# Patient Record
Sex: Male | Born: 1955 | Race: Black or African American | Hispanic: No | Marital: Single | Smoking: Former smoker
Health system: Southern US, Community
[De-identification: ages and names within clinical notes are randomized; demographics above are authoritative.]

## PROBLEM LIST (undated history)

## (undated) DIAGNOSIS — I2510 Atherosclerotic heart disease of native coronary artery without angina pectoris: Secondary | ICD-10-CM

## (undated) DIAGNOSIS — Z9989 Dependence on other enabling machines and devices: Secondary | ICD-10-CM

## (undated) DIAGNOSIS — G4733 Obstructive sleep apnea (adult) (pediatric): Secondary | ICD-10-CM

## (undated) DIAGNOSIS — E119 Type 2 diabetes mellitus without complications: Secondary | ICD-10-CM

## (undated) DIAGNOSIS — I441 Atrioventricular block, second degree: Secondary | ICD-10-CM

## (undated) DIAGNOSIS — D638 Anemia in other chronic diseases classified elsewhere: Secondary | ICD-10-CM

## (undated) DIAGNOSIS — I10 Essential (primary) hypertension: Secondary | ICD-10-CM

## (undated) DIAGNOSIS — I482 Chronic atrial fibrillation, unspecified: Secondary | ICD-10-CM

## (undated) DIAGNOSIS — N185 Chronic kidney disease, stage 5: Secondary | ICD-10-CM

## (undated) DIAGNOSIS — I5032 Chronic diastolic (congestive) heart failure: Secondary | ICD-10-CM

## (undated) DIAGNOSIS — I119 Hypertensive heart disease without heart failure: Secondary | ICD-10-CM

## (undated) DIAGNOSIS — K219 Gastro-esophageal reflux disease without esophagitis: Secondary | ICD-10-CM

## (undated) DIAGNOSIS — I1310 Hypertensive heart and chronic kidney disease without heart failure, with stage 1 through stage 4 chronic kidney disease, or unspecified chronic kidney disease: Secondary | ICD-10-CM

## (undated) DIAGNOSIS — I272 Pulmonary hypertension, unspecified: Secondary | ICD-10-CM

## (undated) DIAGNOSIS — E785 Hyperlipidemia, unspecified: Secondary | ICD-10-CM

## (undated) HISTORY — PX: CARDIAC CATHETERIZATION: SHX172

---

## 2013-10-16 ENCOUNTER — Inpatient Hospital Stay (HOSPITAL_COMMUNITY)
Admission: AD | Admit: 2013-10-16 | Discharge: 2013-10-18 | DRG: 286 | Disposition: A | Discharge status: Home / Self Care | Payer: Non-veteran care | Source: Other Acute Inpatient Hospital | Attending: Internal Medicine | Admitting: Internal Medicine

## 2013-10-16 ENCOUNTER — Encounter (HOSPITAL_COMMUNITY): Payer: Self-pay | Admitting: Internal Medicine

## 2013-10-16 DIAGNOSIS — I5033 Acute on chronic diastolic (congestive) heart failure: Secondary | ICD-10-CM | POA: Diagnosis present

## 2013-10-16 DIAGNOSIS — I4891 Unspecified atrial fibrillation: Secondary | ICD-10-CM | POA: Diagnosis present

## 2013-10-16 DIAGNOSIS — I482 Chronic atrial fibrillation, unspecified: Secondary | ICD-10-CM | POA: Diagnosis not present

## 2013-10-16 DIAGNOSIS — N179 Acute kidney failure, unspecified: Secondary | ICD-10-CM | POA: Diagnosis present

## 2013-10-16 DIAGNOSIS — I441 Atrioventricular block, second degree: Secondary | ICD-10-CM | POA: Diagnosis present

## 2013-10-16 DIAGNOSIS — E785 Hyperlipidemia, unspecified: Secondary | ICD-10-CM | POA: Diagnosis present

## 2013-10-16 DIAGNOSIS — N185 Chronic kidney disease, stage 5: Secondary | ICD-10-CM | POA: Diagnosis present

## 2013-10-16 DIAGNOSIS — IMO0001 Reserved for inherently not codable concepts without codable children: Principal | ICD-10-CM | POA: Diagnosis present

## 2013-10-16 DIAGNOSIS — I480 Paroxysmal atrial fibrillation: Secondary | ICD-10-CM

## 2013-10-16 DIAGNOSIS — I509 Heart failure, unspecified: Secondary | ICD-10-CM

## 2013-10-16 DIAGNOSIS — I1310 Hypertensive heart and chronic kidney disease without heart failure, with stage 1 through stage 4 chronic kidney disease, or unspecified chronic kidney disease: Secondary | ICD-10-CM

## 2013-10-16 DIAGNOSIS — D638 Anemia in other chronic diseases classified elsewhere: Secondary | ICD-10-CM | POA: Diagnosis present

## 2013-10-16 DIAGNOSIS — E119 Type 2 diabetes mellitus without complications: Secondary | ICD-10-CM | POA: Diagnosis present

## 2013-10-16 DIAGNOSIS — Z87891 Personal history of nicotine dependence: Secondary | ICD-10-CM

## 2013-10-16 DIAGNOSIS — I119 Hypertensive heart disease without heart failure: Secondary | ICD-10-CM | POA: Diagnosis present

## 2013-10-16 DIAGNOSIS — Z9989 Dependence on other enabling machines and devices: Secondary | ICD-10-CM

## 2013-10-16 DIAGNOSIS — G4733 Obstructive sleep apnea (adult) (pediatric): Secondary | ICD-10-CM | POA: Diagnosis present

## 2013-10-16 DIAGNOSIS — I2789 Other specified pulmonary heart diseases: Secondary | ICD-10-CM | POA: Diagnosis present

## 2013-10-16 DIAGNOSIS — K219 Gastro-esophageal reflux disease without esophagitis: Secondary | ICD-10-CM | POA: Diagnosis present

## 2013-10-16 DIAGNOSIS — I272 Pulmonary hypertension, unspecified: Secondary | ICD-10-CM | POA: Diagnosis present

## 2013-10-16 DIAGNOSIS — I10 Essential (primary) hypertension: Secondary | ICD-10-CM | POA: Diagnosis present

## 2013-10-16 HISTORY — DX: Hyperlipidemia, unspecified: E78.5

## 2013-10-16 HISTORY — DX: Chronic atrial fibrillation, unspecified: I48.20

## 2013-10-16 HISTORY — DX: Chronic diastolic (congestive) heart failure: I50.32

## 2013-10-16 HISTORY — DX: Hypertensive heart and chronic kidney disease without heart failure, with stage 1 through stage 4 chronic kidney disease, or unspecified chronic kidney disease: I13.10

## 2013-10-16 HISTORY — DX: Pulmonary hypertension, unspecified: I27.20

## 2013-10-16 HISTORY — DX: Atrioventricular block, second degree: I44.1

## 2013-10-16 HISTORY — DX: Anemia in other chronic diseases classified elsewhere: D63.8

## 2013-10-16 HISTORY — DX: Gastro-esophageal reflux disease without esophagitis: K21.9

## 2013-10-16 HISTORY — DX: Essential (primary) hypertension: I10

## 2013-10-16 HISTORY — DX: Chronic kidney disease, stage 5: N18.5

## 2013-10-16 HISTORY — DX: Atherosclerotic heart disease of native coronary artery without angina pectoris: I25.10

## 2013-10-16 HISTORY — DX: Dependence on other enabling machines and devices: Z99.89

## 2013-10-16 HISTORY — DX: Obstructive sleep apnea (adult) (pediatric): G47.33

## 2013-10-16 HISTORY — DX: Hypertensive heart disease without heart failure: I11.9

## 2013-10-16 HISTORY — DX: Type 2 diabetes mellitus without complications: E11.9

## 2013-10-16 LAB — COMPREHENSIVE METABOLIC PANEL
ALT: 59 U/L — AB (ref 0–53)
AST: 26 U/L (ref 0–37)
Albumin: 3.7 g/dL (ref 3.5–5.2)
Alkaline Phosphatase: 74 U/L (ref 39–117)
BUN: 57 mg/dL — ABNORMAL HIGH (ref 6–23)
CALCIUM: 9.2 mg/dL (ref 8.4–10.5)
CO2: 23 meq/L (ref 19–32)
CREATININE: 4.02 mg/dL — AB (ref 0.50–1.35)
Chloride: 102 mEq/L (ref 96–112)
GFR, EST AFRICAN AMERICAN: 18 mL/min — AB (ref >90)
GFR, EST NON AFRICAN AMERICAN: 15 mL/min — AB (ref >90)
GLUCOSE: 131 mg/dL — AB (ref 70–99)
Potassium: 4.3 mEq/L (ref 3.7–5.3)
Sodium: 143 mEq/L (ref 137–147)
Total Bilirubin: 0.3 mg/dL (ref 0.3–1.2)
Total Protein: 7.3 g/dL (ref 6.0–8.3)

## 2013-10-16 LAB — MAGNESIUM: Magnesium: 2.4 mg/dL (ref 1.5–2.5)

## 2013-10-16 LAB — PRO B NATRIURETIC PEPTIDE: Pro B Natriuretic peptide (BNP): 728.2 pg/mL — ABNORMAL HIGH (ref 0–125)

## 2013-10-16 LAB — PROTIME-INR
INR: 1.05 (ref 0.00–1.49)
Prothrombin Time: 13.5 seconds (ref 11.6–15.2)

## 2013-10-16 MED ORDER — INSULIN DETEMIR 100 UNIT/ML ~~LOC~~ SOLN
55.0000 [IU] | Freq: Every day | SUBCUTANEOUS | Status: DC
  Filled 2013-10-16: qty 0.55

## 2013-10-16 MED ORDER — ATORVASTATIN CALCIUM 20 MG PO TABS
20.0000 mg | ORAL_TABLET | Freq: Every day | ORAL | Status: DC
  Administered 2013-10-17: 20 mg via ORAL
  Filled 2013-10-16 (×2): qty 1

## 2013-10-16 MED ORDER — ALBUTEROL SULFATE (2.5 MG/3ML) 0.083% IN NEBU: 2.5000 mg | INHALATION_SOLUTION | Freq: Four times a day (QID) | RESPIRATORY_TRACT | Status: DC | PRN

## 2013-10-16 MED ORDER — SODIUM CHLORIDE 0.9 % IJ SOLN: 3.0000 mL | INTRAMUSCULAR | Status: DC | PRN

## 2013-10-16 MED ORDER — PANTOPRAZOLE SODIUM 40 MG PO TBEC
40.0000 mg | DELAYED_RELEASE_TABLET | Freq: Every day | ORAL | Status: DC
  Administered 2013-10-18: 40 mg via ORAL
  Filled 2013-10-16: qty 1

## 2013-10-16 MED ORDER — FUROSEMIDE 10 MG/ML IJ SOLN
80.0000 mg | Freq: Three times a day (TID) | INTRAMUSCULAR | Status: DC
  Administered 2013-10-16 – 2013-10-18 (×4): 80 mg via INTRAVENOUS
  Filled 2013-10-16 (×8): qty 8

## 2013-10-16 MED ORDER — LABETALOL HCL 300 MG PO TABS
300.0000 mg | ORAL_TABLET | Freq: Two times a day (BID) | ORAL | Status: DC
  Administered 2013-10-16 – 2013-10-18 (×4): 300 mg via ORAL
  Filled 2013-10-16 (×5): qty 1

## 2013-10-16 MED ORDER — INSULIN ASPART 100 UNIT/ML ~~LOC~~ SOLN
0.0000 [IU] | Freq: Three times a day (TID) | SUBCUTANEOUS | Status: DC
  Administered 2013-10-17 (×2): 1 [IU] via SUBCUTANEOUS

## 2013-10-16 MED ORDER — FERROUS SULFATE 325 (65 FE) MG PO TABS
325.0000 mg | ORAL_TABLET | Freq: Every day | ORAL | Status: DC
  Administered 2013-10-17 – 2013-10-18 (×2): 325 mg via ORAL
  Filled 2013-10-16 (×3): qty 1

## 2013-10-16 MED ORDER — SODIUM CHLORIDE 0.9 % IV SOLN: 250.0000 mL | INTRAVENOUS | Status: DC | PRN

## 2013-10-16 MED ORDER — SODIUM CHLORIDE 0.9 % IJ SOLN
3.0000 mL | Freq: Two times a day (BID) | INTRAMUSCULAR | Status: DC
Start: 2013-10-16 — End: 2013-10-18
  Administered 2013-10-16 – 2013-10-17 (×2): 3 mL via INTRAVENOUS

## 2013-10-16 MED ORDER — NIACIN ER 500 MG PO CPCR
500.0000 mg | ORAL_CAPSULE | Freq: Every day | ORAL | Status: DC
  Administered 2013-10-16 – 2013-10-17 (×2): 500 mg via ORAL
  Filled 2013-10-16 (×3): qty 1

## 2013-10-16 MED ORDER — HYDRALAZINE HCL 50 MG PO TABS
50.0000 mg | ORAL_TABLET | Freq: Four times a day (QID) | ORAL | Status: DC
  Administered 2013-10-16 – 2013-10-18 (×6): 50 mg via ORAL
  Filled 2013-10-16 (×10): qty 1

## 2013-10-16 MED ORDER — NITROGLYCERIN 0.4 MG SL SUBL
0.4000 mg | SUBLINGUAL_TABLET | SUBLINGUAL | Status: DC | PRN
Start: 2013-10-16 — End: 2013-10-18

## 2013-10-16 MED ORDER — ISOSORBIDE MONONITRATE ER 60 MG PO TB24
120.0000 mg | ORAL_TABLET | Freq: Every day | ORAL | Status: DC
  Administered 2013-10-17 – 2013-10-18 (×2): 120 mg via ORAL
  Filled 2013-10-16 (×2): qty 2

## 2013-10-16 MED ORDER — ASPIRIN EC 81 MG PO TBEC
81.0000 mg | DELAYED_RELEASE_TABLET | Freq: Every day | ORAL | Status: DC
  Administered 2013-10-17 – 2013-10-18 (×2): 81 mg via ORAL
  Filled 2013-10-16 (×2): qty 1

## 2013-10-16 MED ORDER — HEPARIN SODIUM (PORCINE) 5000 UNIT/ML IJ SOLN
5000.0000 [IU] | Freq: Three times a day (TID) | INTRAMUSCULAR | Status: DC
  Administered 2013-10-17 – 2013-10-18 (×4): 5000 [IU] via SUBCUTANEOUS
  Filled 2013-10-16 (×8): qty 1

## 2013-10-16 NOTE — Progress Notes (Signed)
Pt set up for Auto CPAP via FFM with 2 LPM O2 bleed in.  Pt will self administer CPAP when ready.  RT to monitor and assess as needed.

## 2013-10-16 NOTE — H&P (Signed)
Cardiology History and Physical  No PCP Per Patient  History of Present Illness (and review of medical records): Steven GaviaCharles Wilcox is a 58 y.o. male who presents for evaluation of diastolic heart failure as a transfer [facility].  He has hx of prior systolic heart failure and diastolic heart failure (last known EF 50-55%), Hypertensive heart disease, CKD stage III-IV, DM, Paroxysmal atrial fibrillation, dyslipidemia, DM, and GERD.  He has been reportedly evaluated with prior cardiac cath in 2013 which demonstrated non-obstructive disease ~25% in single vessel.  He was admitted to facility on 10/10/2013 with recurrent dyspnea and hypoxemia.  He has had multiple admissions for CHF in recent months.  There has been some difficulty with diuresis given renal insufficiency.  Patient was transferred for further evaluation by Heart Failure team. Upon presentation, he states his shortness of breath has improved.  He still has orthopnea.  He denies chest pain, palpitations, presyncope or syncope.    Review of Systems Denies nausea,vomiting fevers, chills or abdominal pain.  He reports constipation for past two days. Pertinent items are noted in HPI. Further review of systems was otherwise negative other than stated in HPI.   Past Medical History  Diagnosis Date  . Hypertension   . CHF (congestive heart failure)   . Chronic kidney disease   . GERD (gastroesophageal reflux disease)   . Diabetes mellitus without complication   . Anemia   . Paroxysmal atrial fibrillation   . Dyslipidemia     Past Surgical History  Procedure Laterality Date  . Cardiac catheterization      No prescriptions prior to admission   Allergies not on file  History  Substance Use Topics  . Smoking status: Former Smoker -- 0.14 packs/day for 15 years  . Smokeless tobacco: Not on file  . Alcohol Use: Yes    No family history on file.   Objective:  Patient Vitals for the past 8 hrs:  BP  Temp Temp src Pulse Resp SpO2 Height Weight  10/16/13 2014 134/92 mmHg 97.8 F (36.6 C) Oral 91 18 96 % 5\' 10"  (1.778 m) 110.768 kg (244 lb 3.2 oz)   General appearance: alert, cooperative, appears stated age and no distress, obese male Head: Normocephalic, without obvious abnormality, atraumatic Eyes: conjunctivae/corneas clear. PERRL, EOM's intact. Fundi benign. Neck: thick neck, difficult to appreciate JVD Lungs:decrease air movement, otherwise clear, no rales or wheezing Chest wall: no tenderness Heart: regular rate and rhythm, S1, S2 normal, no murmur, click, rub or gallop Abdomen: soft, non-tender; bowel sounds normal; no masses,  no organomegaly Extremities: extremities normal, atraumatic, no cyanosis or edema Pulses: 2+ and symmetric Neurologic: Grossly normal  No results found for this or any previous visit (from the past 48 hour(s)). No results found.  ECG:  pending  Assessment: Diastolic CHF, EF 96-29%50-55% Acute on CKD Stage 3-4, possible cardiorenal syndrome Hypertension Type 2 DM Dyslipidemia Obstructive Sleep Apnea Anemia of chronic disease  57AAM with above mentioned medical problems admitted for further evaluation and management of recurrent CHF, persistent dyspnea/hypoxemia, along with renal insufficiency.  Plan: 1. Cardiology  Admission  2. Continuous monitoring on Telemetry. 3. Repeat ekg on admit, prn chest pain or arrythmia 4. Basic labs pending 5. Plan for likely RHC in am.  Further assessment for advanced therapies based on results and clinical course. 6. Continue medical management of HF 7. Monitor Cr, electrolytes, may likely need renal consult 8. SSI 9. CPAP QHS

## 2013-10-17 ENCOUNTER — Encounter (HOSPITAL_COMMUNITY)
Admission: AD | Disposition: A | Discharge status: Home / Self Care | Payer: Self-pay | Source: Other Acute Inpatient Hospital | Attending: Internal Medicine

## 2013-10-17 DIAGNOSIS — I5033 Acute on chronic diastolic (congestive) heart failure: Secondary | ICD-10-CM

## 2013-10-17 DIAGNOSIS — I509 Heart failure, unspecified: Secondary | ICD-10-CM

## 2013-10-17 DIAGNOSIS — I4891 Unspecified atrial fibrillation: Secondary | ICD-10-CM

## 2013-10-17 DIAGNOSIS — N19 Unspecified kidney failure: Secondary | ICD-10-CM

## 2013-10-17 DIAGNOSIS — I1310 Hypertensive heart and chronic kidney disease without heart failure, with stage 1 through stage 4 chronic kidney disease, or unspecified chronic kidney disease: Secondary | ICD-10-CM | POA: Diagnosis present

## 2013-10-17 HISTORY — PX: RIGHT HEART CATHETERIZATION: SHX5447

## 2013-10-17 LAB — POCT I-STAT 3, VENOUS BLOOD GAS (G3P V)
ACID-BASE DEFICIT: 4 mmol/L — AB (ref 0.0–2.0)
ACID-BASE DEFICIT: 5 mmol/L — AB (ref 0.0–2.0)
BICARBONATE: 21.2 meq/L (ref 20.0–24.0)
BICARBONATE: 22.3 meq/L (ref 20.0–24.0)
O2 Saturation: 59 %
O2 Saturation: 61 %
PH VEN: 7.294 (ref 7.250–7.300)
TCO2: 22 mmol/L (ref 0–100)
TCO2: 24 mmol/L (ref 0–100)
pCO2, Ven: 43.5 mmHg — ABNORMAL LOW (ref 45.0–50.0)
pCO2, Ven: 43.7 mmHg — ABNORMAL LOW (ref 45.0–50.0)
pH, Ven: 7.319 — ABNORMAL HIGH (ref 7.250–7.300)
pO2, Ven: 33 mmHg (ref 30.0–45.0)
pO2, Ven: 35 mmHg (ref 30.0–45.0)

## 2013-10-17 LAB — GLUCOSE, CAPILLARY
GLUCOSE-CAPILLARY: 127 mg/dL — AB (ref 70–99)
Glucose-Capillary: 113 mg/dL — ABNORMAL HIGH (ref 70–99)
Glucose-Capillary: 119 mg/dL — ABNORMAL HIGH (ref 70–99)
Glucose-Capillary: 126 mg/dL — ABNORMAL HIGH (ref 70–99)
Glucose-Capillary: 134 mg/dL — ABNORMAL HIGH (ref 70–99)

## 2013-10-17 LAB — CBC
HEMATOCRIT: 26.6 % — AB (ref 39.0–52.0)
Hemoglobin: 8.9 g/dL — ABNORMAL LOW (ref 13.0–17.0)
MCH: 31.7 pg (ref 26.0–34.0)
MCHC: 33.5 g/dL (ref 30.0–36.0)
MCV: 94.7 fL (ref 78.0–100.0)
PLATELETS: 227 10*3/uL (ref 150–400)
RBC: 2.81 MIL/uL — ABNORMAL LOW (ref 4.22–5.81)
RDW: 13.4 % (ref 11.5–15.5)
WBC: 8.7 10*3/uL (ref 4.0–10.5)

## 2013-10-17 LAB — BASIC METABOLIC PANEL
BUN: 61 mg/dL — ABNORMAL HIGH (ref 6–23)
CO2: 24 mEq/L (ref 19–32)
Calcium: 9.1 mg/dL (ref 8.4–10.5)
Chloride: 104 mEq/L (ref 96–112)
Creatinine, Ser: 4.11 mg/dL — ABNORMAL HIGH (ref 0.50–1.35)
GFR calc Af Amer: 17 mL/min — ABNORMAL LOW (ref >90)
GFR calc non Af Amer: 15 mL/min — ABNORMAL LOW (ref >90)
Glucose, Bld: 126 mg/dL — ABNORMAL HIGH (ref 70–99)
Potassium: 4.2 mEq/L (ref 3.7–5.3)
Sodium: 144 mEq/L (ref 137–147)

## 2013-10-17 LAB — POCT I-STAT, CHEM 8
BUN: 49 mg/dL — ABNORMAL HIGH (ref 6–23)
CALCIUM ION: 1.14 mmol/L (ref 1.12–1.23)
CHLORIDE: 108 meq/L (ref 96–112)
Creatinine, Ser: 3.9 mg/dL — ABNORMAL HIGH (ref 0.50–1.35)
GLUCOSE: 126 mg/dL — AB (ref 70–99)
HEMATOCRIT: 26 % — AB (ref 39.0–52.0)
Hemoglobin: 8.8 g/dL — ABNORMAL LOW (ref 13.0–17.0)
Potassium: 3.6 mEq/L — ABNORMAL LOW (ref 3.7–5.3)
Sodium: 142 mEq/L (ref 137–147)
TCO2: 20 mmol/L (ref 0–100)

## 2013-10-17 SURGERY — RIGHT HEART CATH
Anesthesia: LOCAL

## 2013-10-17 MED ORDER — SODIUM CHLORIDE 0.9 % IJ SOLN
3.0000 mL | Freq: Two times a day (BID) | INTRAMUSCULAR | Status: DC
Start: 2013-10-17 — End: 2013-10-17
  Administered 2013-10-17: 3 mL via INTRAVENOUS

## 2013-10-17 MED ORDER — SODIUM CHLORIDE 0.9 % IJ SOLN: 3.0000 mL | INTRAMUSCULAR | Status: DC | PRN

## 2013-10-17 MED ORDER — SODIUM CHLORIDE 0.9 % IV SOLN: INTRAVENOUS | Status: DC

## 2013-10-17 MED ORDER — DOCUSATE SODIUM 100 MG PO CAPS
100.0000 mg | ORAL_CAPSULE | Freq: Two times a day (BID) | ORAL | Status: DC | PRN
  Administered 2013-10-17: 100 mg via ORAL
  Filled 2013-10-17 (×3): qty 1

## 2013-10-17 MED ORDER — HEPARIN (PORCINE) IN NACL 2-0.9 UNIT/ML-% IJ SOLN
INTRAMUSCULAR | Status: AC
  Filled 2013-10-17: qty 1000

## 2013-10-17 MED ORDER — COUMADIN BOOK
Freq: Once | Status: AC
  Administered 2013-10-17: 18:00:00
  Filled 2013-10-17: qty 1

## 2013-10-17 MED ORDER — LIDOCAINE HCL (PF) 1 % IJ SOLN
INTRAMUSCULAR | Status: AC
  Filled 2013-10-17: qty 30

## 2013-10-17 MED ORDER — SODIUM CHLORIDE 0.9 % IV SOLN: 250.0000 mL | INTRAVENOUS | Status: DC | PRN

## 2013-10-17 MED ORDER — MIDAZOLAM HCL 2 MG/2ML IJ SOLN
INTRAMUSCULAR | Status: AC
  Filled 2013-10-17: qty 2

## 2013-10-17 MED ORDER — WARFARIN SODIUM 10 MG PO TABS
10.0000 mg | ORAL_TABLET | Freq: Once | ORAL | Status: AC
  Administered 2013-10-17: 10 mg via ORAL
  Filled 2013-10-17: qty 1

## 2013-10-17 MED ORDER — WARFARIN VIDEO
Freq: Once | Status: AC
  Administered 2013-10-17: 18:00:00

## 2013-10-17 MED ORDER — FENTANYL CITRATE 0.05 MG/ML IJ SOLN
INTRAMUSCULAR | Status: AC
Start: 2013-10-17 — End: 2013-10-17
  Filled 2013-10-17: qty 2

## 2013-10-17 MED ORDER — WARFARIN - PHARMACIST DOSING INPATIENT: Freq: Every day | Status: DC

## 2013-10-17 MED ORDER — SODIUM CHLORIDE 0.9 % IJ SOLN
3.0000 mL | Freq: Two times a day (BID) | INTRAMUSCULAR | Status: DC
  Administered 2013-10-17 – 2013-10-18 (×2): 3 mL via INTRAVENOUS

## 2013-10-17 NOTE — Progress Notes (Signed)
Subjective:   Feels better. No dyspnea. Feels he is at his dry weight. Cr now greater than 4.   [facility]    Intake/Output Summary (Last 24 hours) at 10/17/13 1059 Last data filed at 10/17/13 0906  Gross per 24 hour  Intake      0 ml  Output    600 ml  Net   -600 ml    Current meds: . aspirin EC  81 mg Oral Daily  . atorvastatin  20 mg Oral q1800  . ferrous sulfate  325 mg Oral Q breakfast  . furosemide  80 mg Intravenous Q8H  . heparin  5,000 Units Subcutaneous 3 times per day  . hydrALAZINE  50 mg Oral 4 times per day  . insulin aspart  0-9 Units Subcutaneous TID WC  . isosorbide mononitrate  120 mg Oral Daily  . labetalol  300 mg Oral BID  . niacin  500 mg Oral QHS  . pantoprazole  40 mg Oral Q1200  . sodium chloride  3 mL Intravenous Q12H  . sodium chloride  3 mL Intravenous Q12H   Infusions: . [START ON 10/18/2013] sodium chloride       Objective:  Blood pressure 136/96, pulse 90, temperature 98 F (36.7 C), temperature source Oral, resp. rate 18, height 5\' 10"  (1.778 m), weight 110.1 kg (242 lb 11.6 oz), SpO2 97.00%. Weight change:   Physical Exam: General:  Well appearing. No resp difficulty HEENT: normal Neck: supple. JVP 7 . Carotids 2+ bilat; no bruits. No lymphadenopathy or thryomegaly appreciated. Cor: PMI nondisplaced. IRR. No rubs, gallops or murmurs. Lungs: clear Abdomen: soft, nontender, nondistended. No hepatosplenomegaly. No bruits or masses. Good bowel sounds. Extremities: no cyanosis, clubbing, rash, edema Neuro: alert & orientedx3, cranial nerves grossly intact. moves all 4 extremities w/o difficulty. Affect pleasant  Telemetry: AF 90s  Lab Results: Basic Metabolic Panel:  Recent Labs Lab 10/16/13 2310 10/17/13 0620  NA 143 144  K 4.3 4.2  CL 102 104  CO2 23 24  GLUCOSE 131* 126*  BUN 57* 61*  CREATININE 4.02* 4.11*  CALCIUM 9.2 9.1  MG 2.4  --    Liver  Function Tests:  Recent Labs Lab 10/16/13 2310  AST 26  ALT 59*  ALKPHOS 74  BILITOT 0.3  PROT 7.3  ALBUMIN 3.7   No results found for this basename: LIPASE, AMYLASE,  in the last 168 hours No results found for this basename: AMMONIA,  in the last 168 hours CBC: No results found for this basename: WBC, NEUTROABS, HGB, HCT, MCV, PLT,  in the last 168 hours Cardiac Enzymes: No results found for this basename: CKTOTAL, CKMB, CKMBINDEX, TROPONINI,  in the last 168 hours BNP: No components found with this basename: POCBNP,  CBG:  Recent Labs Lab 10/17/13 10/17/13 0613  GLUCAP 126* 134*   Microbiology: No results found for this basename: cult   No results found for this basename: CULT, SDES,  in the last 168 hours  Imaging: No results found.   ASSESSMENT:  1. A/c diastolic HF 2. Cardiorenal syndrome with a/c renal failure Stage IV (says CrCl was 17 in past) 3. Chronic AF - chads 2 = 3 4. HTN 5. DM2 6. OSA 7. Anemia 8. Chest pain - likely due to volume overload cardiac cath 2013. No CAD  PLAN/DISCUSSION:  He has significant diastolic HF with cardiorenal syndrome. Cr now up to 4.0. Suspect he has reached the  point where he will need dialysis to maintain his volume status adequately. Will perform RHC to assess what his filling pressures are currently and help plan timing of need for HD. Likely can go home tomorrow and would switcc lasix to demadex 40 daily. CP likely due tl LV wall stress and not CAD.   Will start coumadin for AF. He is agreeable to this. Will d/w Dr. Andee Linemanegent after cath.  Says he will need ambulance [facility] [place]. Will d/w with CM/SW   LOS: 1 day    Dolores Pattyaniel R Bette Brienza, MD 10/17/2013, 10:59 AM

## 2013-10-17 NOTE — Progress Notes (Signed)
Report received from [facility],RN @1042  am

## 2013-10-17 NOTE — Evaluation (Signed)
Physical Therapy Evaluation Patient Details Name: Steven Wilcox MRN: [id number] DOB: 04/26/1956 Today's Date: 10/17/2013   History of Present Illness  Steven GaviaCharles Wilcox is a 58 y.o. male who presents for evaluation of diastolic heart failure as a transfer from [facility].  He has hx of prior systolic heart failure and diastolic heart failure, HTN, CKD stage III-IV, DM, Paroxysmal atrial fibrillation, dyslipidemia, DM, and GERD.  [facility] with recurrent dyspnea and hypoxemia.  [facility] further workup with cardiac heart failure team.   Clinical Impression  Pt adm due to the above. Pt at baseline for mobility. Pt at mod i to independent for mobility. Requires incr time for gt and mobility secondary to c/o "lightheadedness". O2 sats >92% when ambulating on RA. Education regarding questions regarding foot care and c/o decreased sensation on plantar side of feet. Pt encouraged to ambulate unit as tolerated to promote mobility while in acute setting.     Follow Up Recommendations No PT follow up    Equipment Recommendations  None recommended by PT    Recommendations for Other Services       Precautions / Restrictions Precautions Precautions: None Restrictions Weight Bearing Restrictions: No      Mobility  Bed Mobility Overal bed mobility: Independent                Transfers Overall transfer level: Independent Equipment used: None             General transfer comment: no LOB or sway noted  Ambulation/Gait Ambulation/Gait assistance: Modified independent (Device/Increase time) Ambulation Distance (Feet): 200 Feet Assistive device: None Gait Pattern/deviations: WFL(Within Functional Limits) Gait velocity: decreased due to lightheadedness  Gait velocity interpretation: Below normal speed for age/gender General Gait Details: pt required standing rest break due to fatigue; O2 on RA with ambulation at  92%; pt c/o lightheadedness; no LOB noted with high level balance activities; pt at baseline with gt just fatigued quickly per pt   Stairs            Wheelchair Mobility    Modified Rankin (Stroke Patients Only)       Balance Overall balance assessment: Modified Independent                           High level balance activites: Direction changes;Head turns;Sudden stops;Backward walking High Level Balance Comments: no LOB or sway noted; pt also able to pick object off ground with incr time              Pertinent Vitals/Pain O2 sat on RA at rest 93%; with activity 91-92%; BP in sitting 124/81 after ambulating 149/79    Home Living Family/patient expects to be discharged to:: Private residence Living Arrangements: Alone Available Help at Discharge: Friend(s);Family;Available PRN/intermittently Type of Home: Apartment Home Access: Elevator     Home Layout: One level Home Equipment: None      Prior Function Level of Independence: Independent               Hand Dominance        Extremity/Trunk Assessment   Upper Extremity Assessment: Overall WFL for tasks assessed           Lower Extremity Assessment: Overall WFL for tasks assessed      Cervical / Trunk Assessment: Normal  Communication   Communication: No difficulties  Cognition Arousal/Alertness: Awake/alert Behavior During Therapy: WFL for tasks assessed/performed Overall  Cognitive Status: Within Functional Limits for tasks assessed                      General Comments General comments (skin integrity, edema, etc.): pt educating foot care; pt c/o numbness on plantar aspect of feet and c/o feet feeling "funny on ground" pt encouraged to consult podiatrist for foot care and special shoes to reduce risk of fals; pt appreciative     Exercises General Exercises - Lower Extremity Ankle Circles/Pumps: AROM;Both;10 reps;Supine      Assessment/Plan    PT Assessment  Patent does not need any further PT services  PT Diagnosis     PT Problem List    PT Treatment Interventions     PT Goals (Current goals can be found in the Care Plan section) Acute Rehab PT Goals Patient Stated Goal: home soon PT Goal Formulation: No goals set, d/c therapy    Frequency     Barriers to discharge        Co-evaluation               End of Session Equipment Utilized During Treatment: Gait belt Activity Tolerance: Patient tolerated treatment well Patient left: in bed;with call bell/phone within reach Nurse Communication: Mobility status         Time: [id number] PT Time Calculation (min): 12 min   Charges:   PT Evaluation $Initial PT Evaluation Tier I: 1 Procedure PT Treatments $Gait Training: 8-22 mins   PT G CodesNadara Mustard:          Viola Kinnick N [place], [place]  [phone] 10/17/2013, 5:01 PM

## 2013-10-17 NOTE — Progress Notes (Signed)
ANTICOAGULATION CONSULT NOTE - Initial Consult  Pharmacy Consult:  Coumadin Indication: atrial fibrillation  Allergies  Allergen Reactions  . Ace Inhibitors Cough  . Lisinopril     cough  . Losartan     cough    Patient Measurements: Height: 5\' 10"  (177.8 cm) Weight: 242 lb 11.6 oz (110.1 kg) IBW/kg (Calculated) : 73  Vital Signs: Temp: 98 F (36.7 C) (05/01 0631) Temp src: Oral (05/01 0631) BP: 136/96 mmHg (05/01 1008) Pulse Rate: 77 (05/01 1047)  Labs:  Recent Labs  10/16/13 2310 10/17/13 0620  LABPROT 13.5  --   INR 1.05  --   CREATININE 4.02* 4.11*    Estimated Creatinine Clearance: 25.2 ml/min (by C-G formula based on Cr of 4.11).   Medical History: Past Medical History  Diagnosis Date  . Hypertension   . CHF (congestive heart failure)   . Chronic kidney disease   . GERD (gastroesophageal reflux disease)   . Diabetes mellitus without complication   . Anemia   . Paroxysmal atrial fibrillation   . Dyslipidemia       Assessment: 89 YOM s/p cath to start Coumadin for AFib.  Baseline INR reviewed.  Aware patient is currently on heparin SQ.   Goal of Therapy:  INR 2-3 Monitor platelets by anticoagulation protocol: Yes    Plan:  - Coumadin 10mg  PO today - Continue SQ heparin until INR is therapeutic - Daily PT / INR - Coumadin book / video - CBC in AM    Laban Orourke D. Laney Potashang, PharmD, BCPS Pager:  [phone] 10/17/2013, 2:05 PM

## 2013-10-17 NOTE — Progress Notes (Signed)
UR COMPLETED  

## 2013-10-17 NOTE — CV Procedure (Signed)
Cardiac Cath Procedure Note:  Indication:  Heart failure  Procedures performed:  1) Right heart catheterization  Description of procedure:   The risks and indication of the procedure were explained. Consent was signed and placed on the chart. An appropriate timeout was taken prior to the procedure. The right neck was prepped and draped in the routine sterile fashion and anesthetized with 1% local lidocaine.   A 7 FR venous sheath was placed in the right internal jugular vein using a modified Seldinger technique. A standard Swan-Ganz catheter was used for the procedure.   Complications: None apparent.  Findings:  RA =  6 RV =  45/1/5 PA =  53/15 (31) PCW = 20 Fick cardiac output/index = 6.9/2.9 PVR = 1.6 WU FA sat = 97% PA sat = 61%, 59%  Assessment: 1. Well compensated filling pressures and cardiac output with only mild elevation in PCWP 2. Mild pulmonary venous HTN  Plan/Discussion:  Volume status much improved with diuresis but has significant cardiorenal syndrome. Will likely need dialysis in the near future to adequately maintain euvolemia - [facility]. Will switch lasix to demadex 40 daily. Start coumadin for AF.   Probable d/c in am. I will d/w Dr. Andee Linemanegent.   Bevelyn Bucklesaniel R Chisum Habenicht,MD 11:31 AM

## 2013-10-18 ENCOUNTER — Inpatient Hospital Stay (HOSPITAL_COMMUNITY): Payer: Non-veteran care

## 2013-10-18 ENCOUNTER — Encounter (HOSPITAL_COMMUNITY): Payer: Self-pay | Admitting: General Practice

## 2013-10-18 DIAGNOSIS — E119 Type 2 diabetes mellitus without complications: Secondary | ICD-10-CM | POA: Diagnosis present

## 2013-10-18 DIAGNOSIS — I119 Hypertensive heart disease without heart failure: Secondary | ICD-10-CM | POA: Diagnosis present

## 2013-10-18 DIAGNOSIS — K219 Gastro-esophageal reflux disease without esophagitis: Secondary | ICD-10-CM | POA: Diagnosis present

## 2013-10-18 DIAGNOSIS — I10 Essential (primary) hypertension: Secondary | ICD-10-CM | POA: Diagnosis present

## 2013-10-18 DIAGNOSIS — E785 Hyperlipidemia, unspecified: Secondary | ICD-10-CM | POA: Diagnosis present

## 2013-10-18 DIAGNOSIS — G4733 Obstructive sleep apnea (adult) (pediatric): Secondary | ICD-10-CM | POA: Diagnosis present

## 2013-10-18 DIAGNOSIS — N185 Chronic kidney disease, stage 5: Secondary | ICD-10-CM | POA: Diagnosis present

## 2013-10-18 DIAGNOSIS — D638 Anemia in other chronic diseases classified elsewhere: Secondary | ICD-10-CM | POA: Diagnosis present

## 2013-10-18 DIAGNOSIS — I441 Atrioventricular block, second degree: Secondary | ICD-10-CM | POA: Diagnosis present

## 2013-10-18 DIAGNOSIS — I272 Pulmonary hypertension, unspecified: Secondary | ICD-10-CM | POA: Diagnosis present

## 2013-10-18 DIAGNOSIS — Z9989 Dependence on other enabling machines and devices: Secondary | ICD-10-CM

## 2013-10-18 LAB — CBC
HCT: 25.5 % — ABNORMAL LOW (ref 39.0–52.0)
Hemoglobin: 8.5 g/dL — ABNORMAL LOW (ref 13.0–17.0)
MCH: 31.5 pg (ref 26.0–34.0)
MCHC: 33.3 g/dL (ref 30.0–36.0)
MCV: 94.4 fL (ref 78.0–100.0)
Platelets: 220 10*3/uL (ref 150–400)
RBC: 2.7 MIL/uL — AB (ref 4.22–5.81)
RDW: 13.7 % (ref 11.5–15.5)
WBC: 8.5 10*3/uL (ref 4.0–10.5)

## 2013-10-18 LAB — GLUCOSE, CAPILLARY
Glucose-Capillary: 132 mg/dL — ABNORMAL HIGH (ref 70–99)
Glucose-Capillary: 120 mg/dL — ABNORMAL HIGH (ref 70–99)

## 2013-10-18 LAB — BASIC METABOLIC PANEL
BUN: 62 mg/dL — AB (ref 6–23)
CALCIUM: 9.1 mg/dL (ref 8.4–10.5)
CHLORIDE: 106 meq/L (ref 96–112)
CO2: 23 meq/L (ref 19–32)
CREATININE: 4.02 mg/dL — AB (ref 0.50–1.35)
GFR calc Af Amer: 18 mL/min — ABNORMAL LOW (ref >90)
GFR calc non Af Amer: 15 mL/min — ABNORMAL LOW (ref >90)
Glucose, Bld: 119 mg/dL — ABNORMAL HIGH (ref 70–99)
Potassium: 3.8 mEq/L (ref 3.7–5.3)
Sodium: 144 mEq/L (ref 137–147)

## 2013-10-18 LAB — PROTIME-INR
INR: 1.07 (ref 0.00–1.49)
Prothrombin Time: 13.7 seconds (ref 11.6–15.2)

## 2013-10-18 MED ORDER — NIACIN ER 500 MG PO TBCR: 500.0000 mg | EXTENDED_RELEASE_TABLET | Freq: Every day | ORAL | Status: DC

## 2013-10-18 MED ORDER — LABETALOL HCL 300 MG PO TABS: 300.0000 mg | ORAL_TABLET | Freq: Two times a day (BID) | ORAL | Status: DC

## 2013-10-18 MED ORDER — TORSEMIDE 20 MG PO TABS: 40.0000 mg | ORAL_TABLET | Freq: Every day | ORAL | Status: DC

## 2013-10-18 MED ORDER — ASPIRIN EC 81 MG PO TBEC: 81.0000 mg | DELAYED_RELEASE_TABLET | Freq: Every day | ORAL | Status: AC

## 2013-10-18 MED ORDER — ISOSORBIDE MONONITRATE ER 120 MG PO TB24: 120.0000 mg | ORAL_TABLET | Freq: Every day | ORAL | Status: DC

## 2013-10-18 MED ORDER — WARFARIN SODIUM 5 MG PO TABS: ORAL_TABLET | ORAL | Status: DC

## 2013-10-18 MED ORDER — PANTOPRAZOLE SODIUM 40 MG PO TBEC: 40.0000 mg | DELAYED_RELEASE_TABLET | Freq: Every day | ORAL | Status: DC

## 2013-10-18 MED ORDER — LABETALOL HCL 300 MG PO TABS: 300.0000 mg | ORAL_TABLET | Freq: Two times a day (BID) | ORAL | Status: AC

## 2013-10-18 MED ORDER — PANTOPRAZOLE SODIUM 40 MG PO TBEC: 40.0000 mg | DELAYED_RELEASE_TABLET | Freq: Every day | ORAL | Status: AC

## 2013-10-18 MED ORDER — TORSEMIDE 20 MG PO TABS
40.0000 mg | ORAL_TABLET | Freq: Every day | ORAL | Status: DC
  Administered 2013-10-18: 40 mg via ORAL
  Filled 2013-10-18: qty 2

## 2013-10-18 MED ORDER — NIACIN ER 500 MG PO TBCR: 500.0000 mg | EXTENDED_RELEASE_TABLET | Freq: Every day | ORAL | Status: AC

## 2013-10-18 MED ORDER — WARFARIN SODIUM 5 MG PO TABS: ORAL_TABLET | ORAL | Status: AC

## 2013-10-18 MED ORDER — TORSEMIDE 20 MG PO TABS: 40.0000 mg | ORAL_TABLET | Freq: Every day | ORAL | Status: AC

## 2013-10-18 MED ORDER — ISOSORBIDE MONONITRATE ER 120 MG PO TB24: 120.0000 mg | ORAL_TABLET | Freq: Every day | ORAL | Status: AC

## 2013-10-18 NOTE — Progress Notes (Addendum)
Consulting cardiologist: Dr. Nicholes Mangoan Bensimhon  Subjective:   Patient up in chair this morning. States breathing at baseline.   Objective:   Temp:  [97.3 F (36.3 C)-97.7 F (36.5 C)] 97.3 F (36.3 C) (05/02 0446) Pulse Rate:  [77-92] 92 (05/02 1009) Resp:  [15-18] 18 (05/02 0446) BP: (122-149)/(76-113) 143/78 mmHg (05/02 1009) SpO2:  [94 %-99 %] 94 % (05/02 1009) Weight:  [244 lb (110.678 kg)] 244 lb (110.678 kg) (05/02 0446) Last BM Date: 10/18/13  Filed Weights   10/16/13 2014 10/17/13 0631 10/18/13 0446  Weight: 244 lb 3.2 oz (110.768 kg) 242 lb 11.6 oz (110.1 kg) 244 lb (110.678 kg)    Intake/Output Summary (Last 24 hours) at 10/18/13 1023 Last data filed at 10/18/13 1012  Gross per 24 hour  Intake   1303 ml  Output   1700 ml  Net   -397 ml    Telemetry: Sinus rhythm.  Exam:  General: Appears comfortable.  Lungs: Clear, nonlabored.  Cardiac: RRR, no gallop.  Extremities: No pitting edema.   Lab Results:  Basic Metabolic Panel:  Recent Labs Lab 10/16/13 2310 10/17/13 0620 10/17/13 1107 10/18/13 0525  NA 143 144 142 144  K 4.3 4.2 3.6* 3.8  CL 102 104 108 106  CO2 23 24  --  23  GLUCOSE 131* 126* 126* 119*  BUN 57* 61* 49* 62*  CREATININE 4.02* 4.11* 3.90* 4.02*  CALCIUM 9.2 9.1  --  9.1  MG 2.4  --   --   --     Liver Function Tests:  Recent Labs Lab 10/16/13 2310  AST 26  ALT 59*  ALKPHOS 74  BILITOT 0.3  PROT 7.3  ALBUMIN 3.7    CBC:  Recent Labs Lab 10/17/13 1107 10/18/13 0525  WBC  --  8.5  HGB 8.8* 8.5*  HCT 26.0* 25.5*  MCV  --  94.4  PLT  --  220    Coagulation:  Recent Labs Lab 10/16/13 2310 10/18/13 0525  INR 1.05 1.07    Right heart catheterization 10/17/13: RA = 6  RV = 45/1/5  PA = 53/15 (31)  PCW = 20  Fick cardiac output/index = 6.9/2.9  PVR = 1.6 WU  FA sat = 97%  PA sat = 61%, 59%   Assessment:  1. Well compensated filling pressures and cardiac output with only mild elevation in PCWP 2.  Mild pulmonary venous HTN   Medications:   Scheduled Medications: . aspirin EC  81 mg Oral Daily  . atorvastatin  20 mg Oral q1800  . ferrous sulfate  325 mg Oral Q breakfast  . furosemide  80 mg Intravenous Q8H  . heparin  5,000 Units Subcutaneous 3 times per day  . hydrALAZINE  50 mg Oral 4 times per day  . insulin aspart  0-9 Units Subcutaneous TID WC  . isosorbide mononitrate  120 mg Oral Daily  . labetalol  300 mg Oral BID  . niacin  500 mg Oral QHS  . pantoprazole  40 mg Oral Q1200  . sodium chloride  3 mL Intravenous Q12H  . sodium chloride  3 mL Intravenous Q12H  . Warfarin - Pharmacist Dosing Inpatient   Does not apply q1800      PRN Medications:  sodium chloride, sodium chloride, albuterol, docusate sodium, nitroGLYCERIN, sodium chloride, sodium chloride   Assessment:   1. A/C diastolic HF. LVEF 50-55%. Right heart catheterization yesterday showed reasonably well compensated filling pressures, PASP 53 mmHg, normal cardiac  output with mild elevation in PCWP. Dr. Gala RomneyBensimhon has recommended a change to [facility] for diuretic.  2. Cardiorenal syndrome with a/c renal failure Stage V (says CrCl was 17 in past).  3. Chronic AF - CHADSVASC 4. Coumadin per pharmacy.  4. HTN.  5. DM2.  6. OSA on CPAP.  7. Anemia.  8. Chest pain - likely due to volume overload cardiac cath 2013. No obstructive CAD.   Plan/Discussion:    I reviewed the recent workup and planned disposition per Dr. Gala RomneyBensimhon. I asked nursing to check with case manager to see if ambulance transport for [place] is feasible. Demadex 40 mg daily is being started for diuretic. On Coumadin per pharmacy - subtherapeutic INR with initiation. He will need to have a followup PT/INR in 4 days with primary care provider. Patient will need to [facility]up [facility] regarding initiation of hemodialysis for better volume management going forward.   Jonelle SidleSamuel G. Sila Sarsfield,  M.D., F.A.C.C.

## 2013-10-18 NOTE — Progress Notes (Signed)
[facility] [phone] arranged to take patient home. Home address verified with patient. Medical necessity form on patient chart, RN updated.  Samuella BruinKristin Raysa Bosak, MSW, LCSWA Clinical Social Worker [facility]. [phone]

## 2013-10-18 NOTE — Discharge Instructions (Signed)
Information on my medicine - Coumadin®   (Warfarin) ° °This medication education was reviewed with me or my healthcare representative as part of my discharge preparation.  The pharmacist that spoke with me during my hospital stay was:  Dmiya Malphrus Danielle [place], RPH ° °Why was Coumadin prescribed for you? °Coumadin was prescribed for you because you have a blood clot or a medical condition that can cause an increased risk of forming blood clots. Blood clots can cause serious health problems by blocking the flow of blood to the heart, lung, or brain. Coumadin can prevent harmful blood clots from forming. °As a reminder your indication for Coumadin is:   Stroke Prevention Because Of Atrial Fibrillation ° °What test will check on my response to Coumadin? °While on Coumadin (warfarin) you will need to have an INR test regularly to ensure that your dose is keeping you in the desired range. The INR (international normalized ratio) number is calculated from the result of the laboratory test called prothrombin time (PT). ° °If an INR APPOINTMENT HAS NOT ALREADY BEEN MADE FOR YOU please schedule an appointment to have this lab work done by your health care provider within 7 days. °Your INR goal is usually a number between:  2 to 3 or your provider may give you a more narrow range like 2-2.5.  Ask your health care provider during an office visit what your goal INR is. ° °What  do you need to  know  About  COUMADIN? °Take Coumadin (warfarin) exactly as prescribed by your healthcare provider about the same time each day.  DO NOT stop taking without talking to the doctor who prescribed the medication.  Stopping without other blood clot prevention medication to take the place of Coumadin may increase your risk of developing a new clot or stroke.  Get refills before you run out. ° °What do you do if you miss a dose? °If you miss a dose, take it as soon as you remember on the same day then continue your regularly scheduled regimen  the next day.  Do not take two doses of Coumadin at the same time. ° °Important Safety Information °A possible side effect of Coumadin (Warfarin) is an increased risk of bleeding. You should call your healthcare provider right away if you experience any of the following: °  Bleeding from an injury or your nose that does not stop. °  Unusual colored urine (red or dark brown) or unusual colored stools (red or black). °  Unusual bruising for unknown reasons. °  A serious fall or if you hit your head (even if there is no bleeding). ° °Some foods or medicines interact with Coumadin® (warfarin) and might alter your response to warfarin. To help avoid this: °  Eat a balanced diet, maintaining a consistent amount of Vitamin K. °  Notify your provider about major diet changes you plan to make. °  Avoid alcohol or limit your intake to 1 drink for women and 2 drinks for men per day. °(1 drink is 5 oz. wine, 12 oz. beer, or 1.5 oz. liquor.) ° °Make sure that ANY health care provider who prescribes medication for you knows that you are taking Coumadin (warfarin).  Also make sure the healthcare provider who is monitoring your Coumadin knows when you have started a new medication including herbals and non-prescription products. ° °Coumadin® (Warfarin)  Major Drug Interactions  °Increased Warfarin Effect Decreased Warfarin Effect  °Alcohol (large quantities) °Antibiotics (esp. Septra/Bactrim, Flagyl, Cipro) °Amiodarone (Cordarone) °Aspirin (  naproxen, etc.) °Piroxicam (Feldene) °Propafenone (Rythmol SR) °Propranolol (Inderal) °Isoniazid (INH) °Posaconazole (Noxafil) Barbiturates (Phenobarbital) °Carbamazepine (Tegretol) °Chlordiazepoxide (Librium) °Cholestyramine (Questran) °Griseofulvin °Oral Contraceptives °Rifampin °Sucralfate (Carafate) °Vitamin K  ° °Coumadin® (Warfarin) Major Herbal Interactions  °Increased Warfarin Effect Decreased Warfarin Effect   °Garlic °Ginseng °Ginkgo biloba Coenzyme Q10 °Green tea °St. John’s wort   ° °Coumadin® (Warfarin) FOOD Interactions  °Eat a consistent number of servings per week of foods HIGH in Vitamin K °(1 serving = ½ cup)  °Collards (cooked, or boiled & drained) °Kale (cooked, or boiled & drained) °Mustard greens (cooked, or boiled & drained) °Parsley *serving size only = ¼ cup °Spinach (cooked, or boiled & drained) °Swiss chard (cooked, or boiled & drained) °Turnip greens (cooked, or boiled & drained)  °Eat a consistent number of servings per week of foods MEDIUM-HIGH in Vitamin K °(1 serving = 1 cup)  °Asparagus (cooked, or boiled & drained) °Broccoli (cooked, boiled & drained, or raw & chopped) °Brussel sprouts (cooked, or boiled & drained) *serving size only = ½ cup °Lettuce, raw (green leaf, endive, romaine) °Spinach, raw °Turnip greens, raw & chopped  ° °These websites have more information on Coumadin (warfarin):  www.coumadin.com; °www.ahrq.gov/consumer/coumadin.htm; ° ° °

## 2013-10-18 NOTE — Progress Notes (Signed)
ANTICOAGULATION CONSULT NOTE - Follow Up Consult  Pharmacy Consult for Coumadin Indication: atrial fibrillation  Allergies  Allergen Reactions  . Ace Inhibitors Cough  . Lisinopril     cough  . Losartan     cough    Patient Measurements: Height: 5\' 10"  (177.8 cm) Weight: 244 lb (110.678 kg) (scale b) IBW/kg (Calculated) : 73  Vital Signs: Temp: 97.3 F (36.3 C) (05/02 0446) Temp src: Oral (05/02 0446) BP: 143/78 mmHg (05/02 1009) Pulse Rate: 92 (05/02 1009)  Labs:  Recent Labs  10/16/13 2310 10/17/13 0620 10/17/13 1107 10/18/13 0525  HGB  --   --  8.8* 8.5*  HCT  --   --  26.0* 25.5*  PLT  --   --   --  220  LABPROT 13.5  --   --  13.7  INR 1.05  --   --  1.07  CREATININE 4.02* 4.11* 3.90* 4.02*    Estimated Creatinine Clearance: 25.9 ml/min (by C-G formula based on Cr of 4.02).   Assessment: 58 y/o male newly started on Coumadin for Afib. As expected, INR is subtherapeutic at 1.07 after first dose given last night. No bleeding noted, Hb is low but stable, platelets are normal. Hard to tell what Coumadin dose will be required long term since he's only had one dose but see recommendation below.  Goal of Therapy:  INR 2-3 Monitor platelets by anticoagulation protocol: Yes   Plan:  - Recommend prescribing 5 mg Coumadin tablets and have patient take 7.5 mg alternating with 5 mg daily until seen by Coumadin clinic. Start with 7.5 mg tonight   TuscaloosaJennifer Collins, [address] Rainbow BoulevardPh[address].D., BCPS Clinical Pharmacist Pager: [phone] 10/18/2013 11:36 AM

## 2013-10-18 NOTE — Discharge Summary (Signed)
Please see also rounding note. 

## 2013-10-18 NOTE — Discharge Summary (Signed)
Discharge Summary   Patient ID: Steven GaviaCharles Wilcox MRN: [id number], DOB/AGE: 56/11/1955 56 y.o. Admit date: 10/16/2013 D/C date:     10/18/2013  Primary Care Provider: Dr. Orrin BrighamGehris at [facility] Nephrologist: [facility]Monin [facility] Primary Cardiologist: Seen by CHF Team Dr. Gala [facility]Bensimhon at [facility], [place]  Primary Discharge Diagnoses:  1. Acute on chronic diastolic CHF 2. Cardiorenal syndrome with acute on chronic renal insufficiency stage V, suspect nearing need for dialysis to maintain volume status 3. Chronic atrial fibrillation, started on Coumadin this admission 4. HTN 5. Diabetes mellitus type II 6. OSA on CPAP 7. Anemia of chronic disease 8. Chest pain, likely due to volume overload (cardiac cath 2013 with nonobstructive disease) 9. 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 Hypertensive heart disease  Secondary Discharge Diagnoses:  1. Dyslipidemia 2. GERD 3. Hx of mobitz type II [facility]  Hospital Course: Steven Wilcox is a 58 y/o M with history of diastolic CHF, HTN, CKD, DM, anemia of chronic disease who [facility] facilitated by Dr. Andee LinemaneGent. He has a history of prior systolic heart failure and diastolic heart failure (last known EF 50-55%), and last cath 2013 reportedly demonstrated non-obstructive disease ~25% in single vessel. He was admitted to facility on 10/10/2013 with recurrent dyspnea and hypoxemia. He has had multiple admissions for CHF in recent months. There has been some difficulty with diuresis given renal insufficiency. In the past he has apparently talked to [place] nephrolog[place] in [place] about peritoneal dialysis. CT chest without contrast showed bibasilar airspace consolidations with ground glass opacities and pleural effusions. VQ scan was negative and LE duplex reported to be neg for DVTs. [facility] for further evaluation by Heart  Failure team. Upon presentation, his SOB had improved but he still had orthopnea. He denied chest pain, palpitations, presyncope or syncope. Cr was up to 4 concerning for cardiorenal syndrome. RHC demonstrated well compensated filling pressures and cardiac output with only mild elevation in PCWP, and mild pulmonary venous HTN. Dr. Gala RomneyBensimhon suspects he has reached the point where he will need dialysis to maintain his volume status adequately. The patient was instructed to f[facility] and call Monday to discuss with his nephrologist. Lasix was switched to Demadex. Coumadin was started for his AF given CHADSVASC score of 4 (CHF, HTN, DM, and possible vasc disease if you count nonobst CAD 2013). He was seen by PT and did not require any PT followup or supplemental O2. DC weight is 244. Breathing is back to baseline. Imdur and labetolol were both titrated during admission. Pharmacy has assisted with recommendations for Coumadin at discharge. We have asked the patient to call PCP on Monday to schedule INR check between 5/4 and 5/5. His Lantus was stopped this admission because CBGs were in the 120-130 range off of this medication - we have kept this on hold at discharge and asked him to f/u with his PCP regarding management. We asked him to stop aspirin when his INR level is therapeutic between 2-3. Dr. Diona BrownerMcDowell has seen and examined the patient today and feels he is stable for discharge.   Discharge Vitals: Blood pressure 143/78, pulse 92, temperature 97.3 F (36.3 C), temperature source Oral, resp. rate 18, height 5\' 10"  (1.778 m), weight 244 lb (110.678 kg), SpO2 94.00%.  Labs: Lab Results  Component Value Date   WBC 8.5 10/18/2013   HGB 8.5* 10/18/2013  HCT 25.5* 10/18/2013   MCV 94.4 10/18/2013   PLT 220 10/18/2013    Recent Labs Lab 10/16/13 2310  10/18/13 0525  NA 143  < > 144  K 4.3  < > 3.8  CL 102  < > 106  CO2 23  < > 23  BUN 57*  < > 62*  CREATININE 4.02*  < > 4.02*  CALCIUM 9.2  < > 9.1    PROT 7.3  --   --   BILITOT 0.3  --   --   ALKPHOS 74  --   --   ALT 59*  --   --   AST 26  --   --   GLUCOSE 131*  < > 119*  < > = values in this interval not displayed.   Diagnostic Studies/Procedures   Dg Chest 2 View 10/18/2013   CLINICAL DATA:  Shortness of breath.  EXAM: CHEST  2 VIEW  COMPARISON:  None.  FINDINGS: The cardiac silhouette, mediastinal hilar contours are within normal limits. The lungs are clear. No pleural effusion. The bony thorax is intact.  IMPRESSION: No acute cardiopulmonary findings.   Electronically Signed   By: Loralie Champagne M.D.   On: 10/18/2013 08:52   Outside Hospital Records: LE duplex - bilaterally no DVTs per chart CT chest without contrast 4/24 - bibasilar airspace consolidations with scattered ground-glass opacities also seen at R[facility]. Small mod bilat pleural eff. CXR 4/24 - cardiomegaly. Question mild developing pulm venous htn. Correlate clinically. No focal infiltrate. 10/10/13 - low probability VQ scan  RHC 10/17/13  Cardiac Cath Procedure Note:  Indication: Heart failure  Procedures performed:  1) Right heart catheterization  Description of procedure:  The risks and indication of the procedure were explained. Consent was signed and placed on the chart. An appropriate timeout was taken prior to the procedure. The right neck was prepped and draped in the routine sterile fashion and anesthetized with 1% local lidocaine.  A 7 FR venous sheath was placed in the right internal jugular vein using a modified Seldinger technique. A standard Swan-Ganz catheter was used for the procedure.  Complications: None apparent.  Findings:  RA = 6  RV = 45/1/5  PA = 53/15 (31)  PCW = 20  Fick cardiac output/index = 6.9/2.9  PVR = 1.6 WU  FA sat = 97%  PA sat = 61%, 59%  Assessment:  1. Well compensated filling pressures and cardiac output with only mild elevation in PCWP 2. Mild pulmonary venous HTN  Plan/Discussion:  Volume status much improved with  diuresis but has significant cardiorenal syndrome. Will likely need dialysis in the near future to adequately maintain euvolemia - this can be arranged by [facility] Will switch lasix to demadex 40 daily. Start coumadin for AF.  Probable d/c in am. I will d/w Dr. Andee Lineman.  Bevelyn Buckles Bensimhon,MD  11:31 AM   Discharge Medications   Current Discharge Medication List    START taking these medications   Details  pantoprazole (PROTONIX) 40 MG tablet Take 1 tablet (40 mg total) by mouth daily. Qty: 30 tablet, Refills: 1    torsemide (DEMADEX) 20 MG tablet Take 2 tablets (40 mg total) by mouth daily. Qty: 60 tablet, Refills: 2    warfarin (COUMADIN) 5 MG tablet Take by mouth - alternate 1.5 tablets (7.5mg ) with 1 tablet (5mg ) daily. (Starting with 1.5 tablets tonight.) Qty: 60 tablet, Refills: 1      CONTINUE these medications which have CHANGED  Details  aspirin EC 81 MG tablet Take 1 tablet (81 mg total) by mouth daily. You may stop aspirin when your Coumadin level is therapeutic between 2 and 3.    isosorbide mononitrate (IMDUR) 120 MG 24 hr tablet Take 1 tablet (120 mg total) by mouth daily. Qty: 30 tablet, Refills: 2    labetalol (NORMODYNE) 300 MG tablet Take 1 tablet (300 mg total) by mouth 2 (two) times daily. Qty: 60 tablet, Refills: 2    niacin (SLO-NIACIN) 500 MG tablet Take 1 tablet (500 mg total) by mouth at bedtime.      CONTINUE these medications which have NOT CHANGED   Details  albuterol (PROVENTIL HFA;VENTOLIN HFA) 108 (90 BASE) MCG/ACT inhaler Inhale 2 puffs into the lungs every 6 (six) hours as needed for wheezing or shortness of breath.    calcitRIOL (ROCALTROL) 0.25 MCG capsule Take 0.25 mcg by mouth daily.    cetirizine (ZYRTEC) 10 MG tablet Take 10 mg by mouth daily.    ferrous sulfate 325 (65 FE) MG tablet Take 325 mg by mouth daily with breakfast.    hydrALAZINE (APRESOLINE) 50 MG tablet Take 50 mg by mouth 4 (four) times daily.    nitroGLYCERIN  (NITROSTAT) 0.4 MG SL tablet Place 0.4 mg under the tongue every 5 (five) minutes as needed for chest pain.    simvastatin (ZOCOR) 5 MG tablet Take 5 mg by mouth at bedtime.    terazosin (HYTRIN) 2 MG capsule Take 4 mg by mouth at bedtime.      STOP taking these medications     furosemide (LASIX) 40 MG tablet      insulin glargine (LANTUS) 100 UNIT/ML injection      NIFEdipine (PROCARDIA XL/ADALAT-CC) 90 MG 24 hr tablet      Omega-3 Fatty Acids (FISH OIL) 1000 MG CAPS      prazosin (MINIPRESS) 1 MG capsule         Disposition   The patient will be discharged in stable condition to home. Discharge Orders   Future Orders Complete By Expires   Diet - low sodium heart healthy  As directed    Scheduling Instructions:   For patients with congestive heart failure, we give them these special instructions:  1. Follow a low-salt diet and watch your fluid intake. In general, you should not be taking in more than 2 liters of fluid per day (no more than 8 glasses per day). Some patients are restricted to less than 1.5 liters of fluid per day (no more than 6 glasses per day). This includes sources of water in foods like soup, coffee, tea, milk, etc. 2. Weigh yourself on the same scale at same time of day and keep a log. 3. Call your doctor: (Anytime you feel any of the following symptoms)  - 3-4 pound weight gain in 1-2 days or 2 pounds overnight  - Shortness of breath, with or without a dry hacking cough  - Swelling in the hands, feet or stomach  - If you have to sleep on extra pillows at night in order to breathe  IT IS IMPORTANT TO LET YOUR DOCTOR KNOW EARLY ON IF YOU ARE HAVING SYMPTOMS SO WE CAN HELP YOU!   Increase activity slowly  As directed    Scheduling Instructions:   Please contact your primary care doctor on Monday to arrange getting your Coumadin level checked between Monday (5/4) and Tuesday (5/5). This is very important. They will tell you if your dose needs adjusting.  Please call your nephrologist on Monday to discuss this hospitalization and schedule follow-up visit within 1 week. Dr. Gala RomneyBensimhon felt you are getting close to the point where you will need dialysis to maintain your volume status.  No driving for 2 days. No lifting over 5 lbs for 1 week. No sexual activity for 1 week. Keep procedure site clean & dry. If you notice increased pain, swelling, bleeding or pus, call/return!  You may shower, but no soaking baths/hot tubs/pools for 1 week.  Your blood sugar was actually fairly controlled while off on the Lantus (120s-130s). We have not yet restarted this medicine. Please call your primary care doctor on Monday to discuss a plan for your diabetes medicines.     Follow-up Information   Follow up with Primary Care Doctor. (See discharge instructions)         Duration of Discharge Encounter: Greater than 30 minutes including physician and PA time.  Signed, Laurann Montanaayna N Dunn PA-C 10/18/2013, 12:36 PM

## 2013-10-18 NOTE — Progress Notes (Signed)
[place] to [place]; [place] [facility].  IV removed prior to discharge; IV site clean, dry, and intact.  Discharge instructions, education, and medications discussed with patient prior to discharge; patient voiced understanding of discharge information.  Patient [facility] Monday, so patient provided with scripts for 5 days of new medications from [facility] pharmacy, then with individual prescriptions to take to the [facility] (see Care Management note).  Patient denies any questions or concerns at time of discharge.

## 2013-10-18 NOTE — Plan of Care (Signed)
Problem: Phase I Progression Outcomes Goal: EF % per last Echo/documented,Core Reminder form on chart Outcome: Completed/Met Date Met:  10/18/13 50-55% per records from [facility]

## 2013-10-18 NOTE — Progress Notes (Signed)
Clinical Child psychotherapistocial Worker (CSW) cancelled [facility] because patient needed [facility]. Per RN case manager prescriptions have been arranged and PTAR can be called again. CSW contacted [facility] and arranged transport. Please reconsult if further social work needs arise. CSW signing off.   Jetta LoutBailey Morgan, LCSWA Weekend CSW [phone]

## 2013-10-19 NOTE — Care Management Note (Signed)
    Page 1 of 1   10/19/2013     8:25:43 AM CARE MANAGEMENT NOTE 10/19/2013  Patient:  Steven Wilcox,Steven Wilcox   Account Number:  [id number]  Date Initiated:  10/17/2013  Documentation initiated by:  Jiles CrockerHANDLER,BRENDA  Subjective/Objective Assessment:   ADMITTED WITH CHF     Action/Plan:   CM FOLLOWING FOR DCP   Anticipated DC Date:  10/20/2013   Anticipated DC Plan:  HOME W HOME HEALTH SERVICES      DC Planning Services  CM consult      Choice offered to / List presented to:             Status of service:  Completed, signed off Medicare Important Message given?   (If response is "NO", the following Medicare IM given date fields will be blank) Date Medicare IM given:   Date Additional Medicare IM given:    Discharge Disposition:  HOME/SELF CARE  Per UR Regulation:  Reviewed for med. necessity/level of care/duration of stay  If discussed at Long Length of Stay Meetings, dates discussed:    Comments:  10/18/13 17:00 CM called to unit by RN as PTAR already on unit to transport pt home and pt did not have access to medications until he [facility]/13. RN stated MD told pt we could give him meds to "tide him over" until he could get to [facility].  CM spoke with pt in room and asked to see his prescriptions.  MD had faxed/called in prescriptions to a [facility] in the pt's hometown.  Pt states he has never used this pharmay and did not know why MD did this.  CM called pharmacy to see if they could give pt enough of  his Demadex and coumadin to last him until he could get his medications form the [facility].  CM called MD to request all new prescriptions so pt could take them with him and prescriptions for the in-house pharmacy to fill for the next couple of days to have pt covered until he gets to the clinic.  CM explained to the MD this is not how our system works.  Pt received 5 days of his Demadex and coumadin through our pharmacy; pt has other medications at home; pt received  new prescriptions to give to the [facility].  PT[facility] was rearranged for pt transport.  No other CM needs were communicated.  Steven Wilcox, BSN, CM [phone].  5/1/205Abelino Derrick- B CHANDLER RN,BSN,MHA [phone]

## 2014-05-28 ENCOUNTER — Encounter (HOSPITAL_COMMUNITY): Payer: Self-pay | Admitting: Internal Medicine

## 2014-09-04 IMAGING — CR DG CHEST 2V
2 series · 2 of 2 positions shown · non-contrast
Comparison: None.

CLINICAL DATA: Shortness of breath.

EXAM:
CHEST  2 VIEW

[w chest pa]
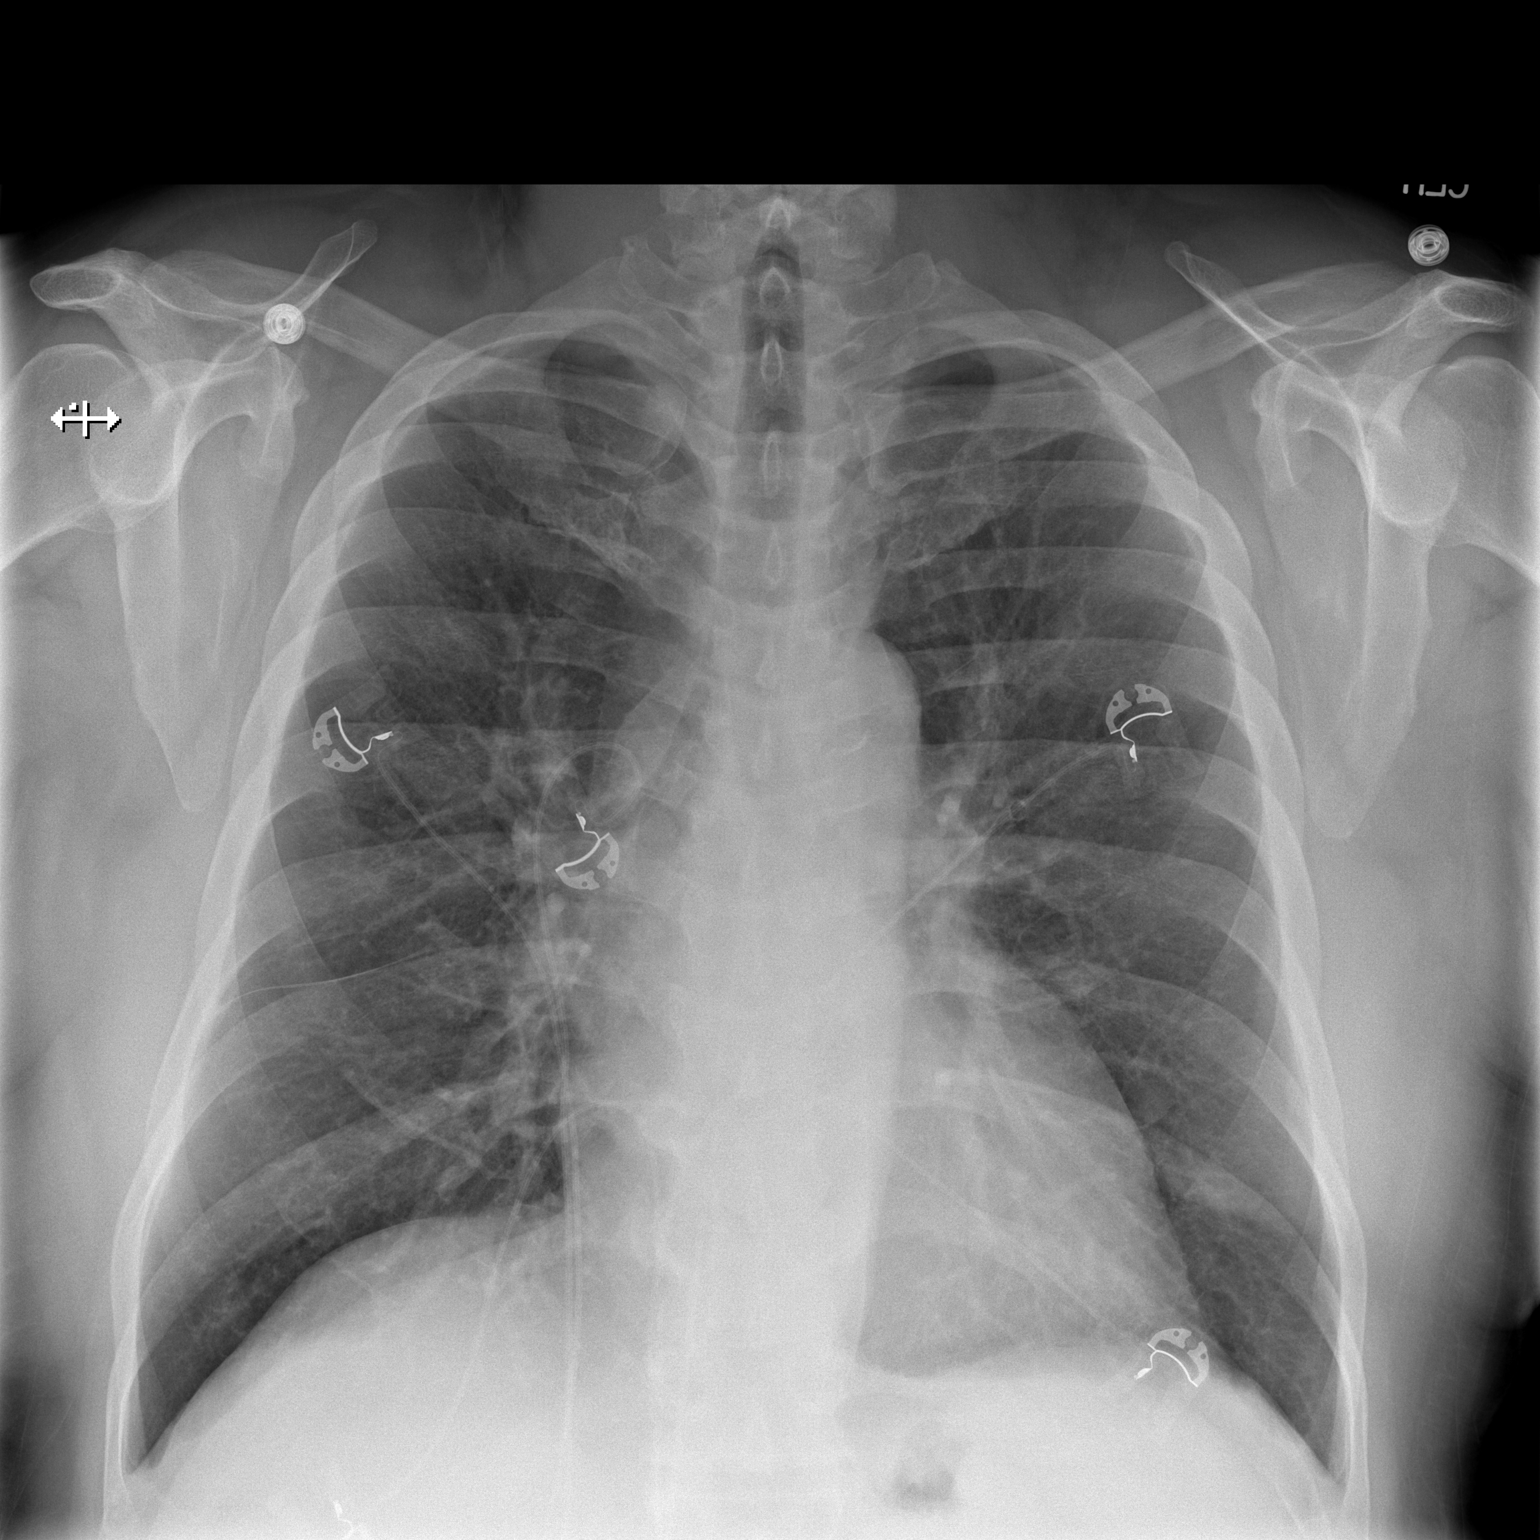

[w chest lat]
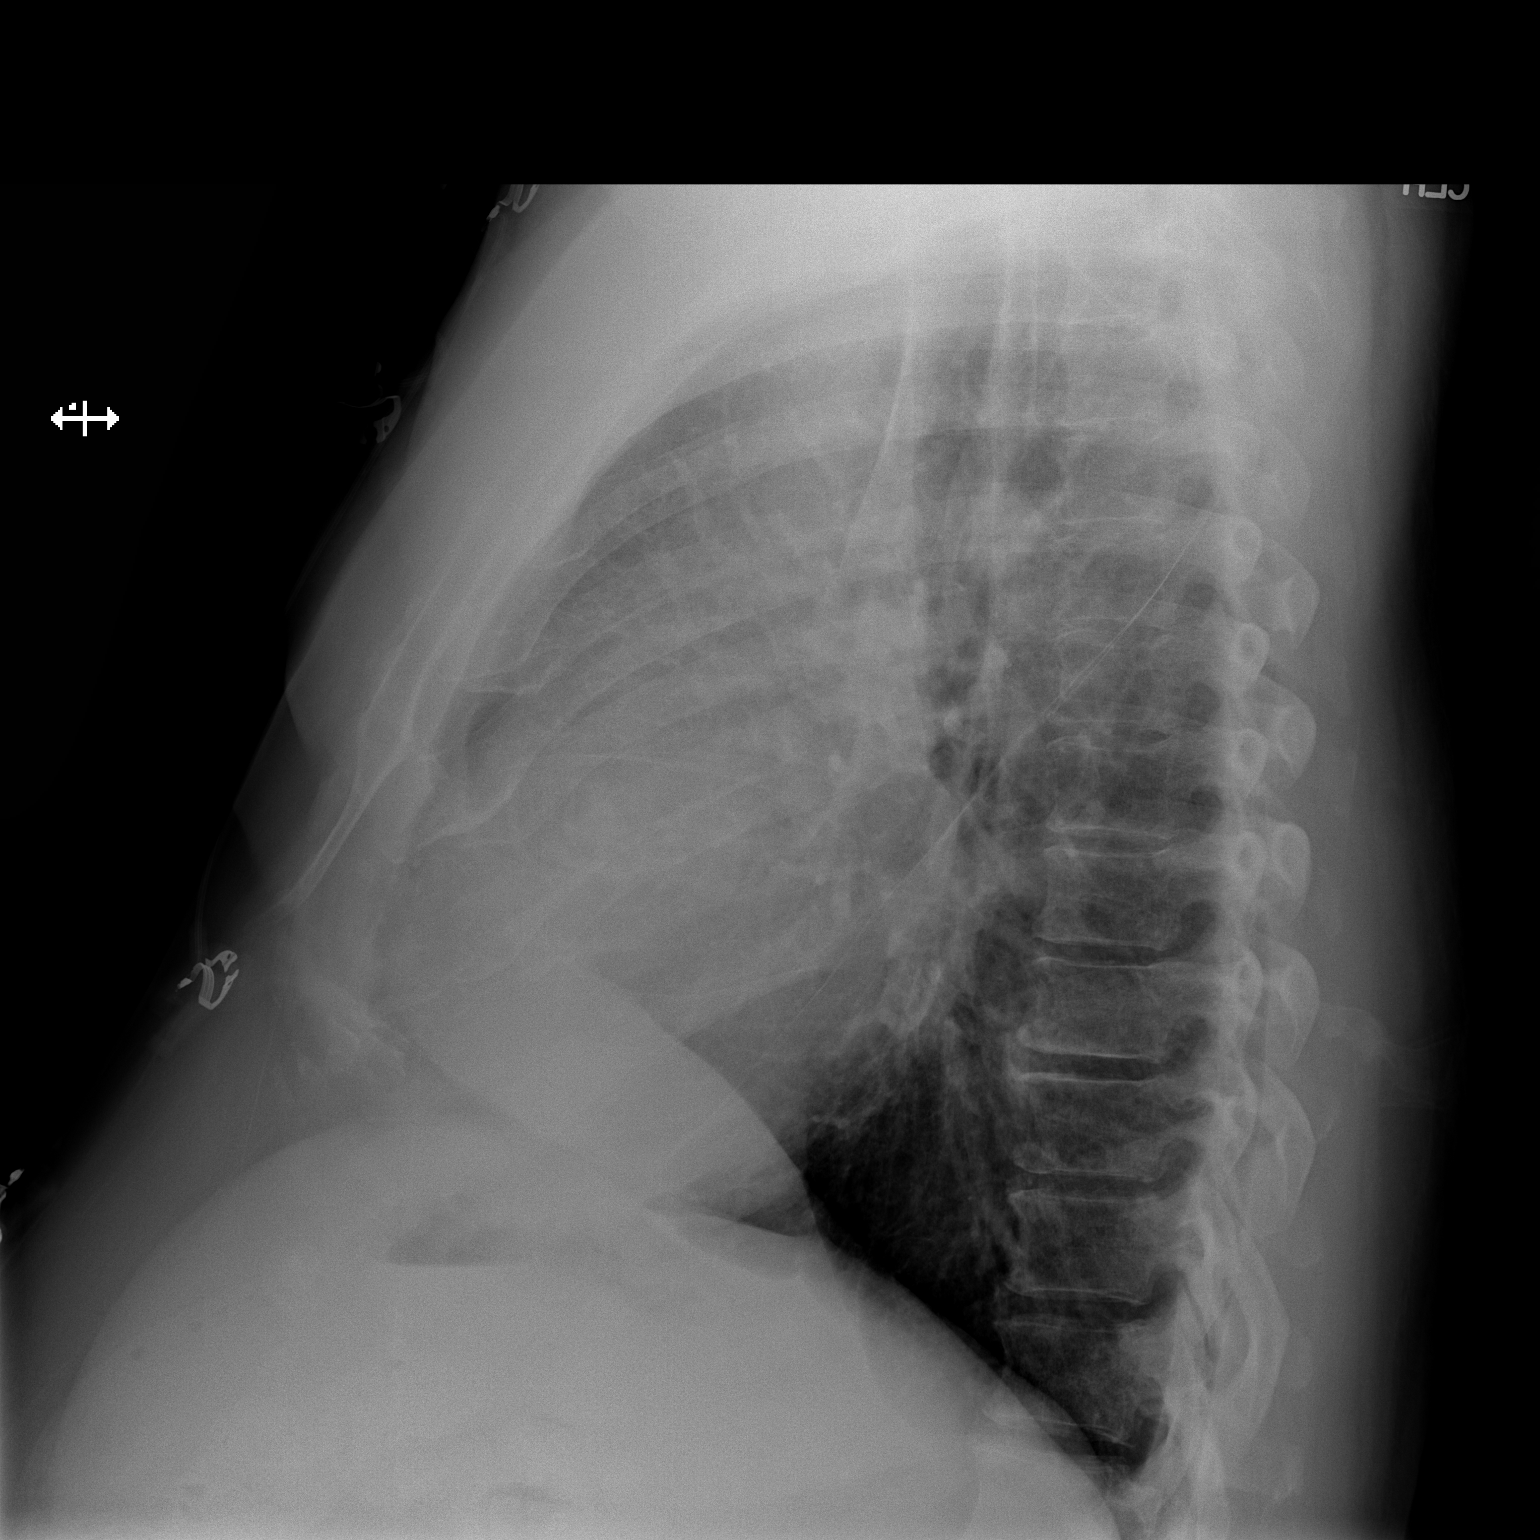

[2 of 2 positions shown; findings below may reference images not displayed]

FINDINGS: The cardiac silhouette, mediastinal hilar contours are within normal
limits. The lungs are clear. No pleural effusion. The bony thorax is
intact.
IMPRESSION: No acute cardiopulmonary findings.
